# Patient Record
Sex: Female | Born: 1960 | Race: Black or African American | Hispanic: No | Marital: Single | Smoking: Current every day smoker
Health system: Southern US, Community
[De-identification: ages and names within clinical notes are randomized; demographics above are authoritative.]

## PROBLEM LIST (undated history)

## (undated) DIAGNOSIS — J42 Unspecified chronic bronchitis: Secondary | ICD-10-CM

---

## 2008-03-09 ENCOUNTER — Emergency Department (HOSPITAL_COMMUNITY): Admission: EM | Admit: 2008-03-09 | Discharge: 2008-03-09 | Payer: Self-pay | Admitting: Emergency Medicine

## 2008-03-18 ENCOUNTER — Emergency Department (HOSPITAL_COMMUNITY): Admission: EM | Admit: 2008-03-18 | Discharge: 2008-03-18 | Payer: Self-pay | Admitting: Emergency Medicine

## 2009-06-27 ENCOUNTER — Emergency Department (HOSPITAL_BASED_OUTPATIENT_CLINIC_OR_DEPARTMENT_OTHER): Admission: EM | Admit: 2009-06-27 | Discharge: 2009-06-27 | Payer: Self-pay | Admitting: Emergency Medicine

## 2009-06-27 ENCOUNTER — Ambulatory Visit: Payer: Self-pay | Admitting: Diagnostic Radiology

## 2009-07-08 ENCOUNTER — Ambulatory Visit: Payer: Self-pay | Admitting: Diagnostic Radiology

## 2009-07-08 ENCOUNTER — Emergency Department (HOSPITAL_BASED_OUTPATIENT_CLINIC_OR_DEPARTMENT_OTHER): Admission: EM | Admit: 2009-07-08 | Discharge: 2009-07-08 | Payer: Self-pay | Admitting: Emergency Medicine

## 2010-07-13 LAB — RAPID STREP SCREEN (MED CTR MEBANE ONLY): Streptococcus, Group A Screen (Direct): POSITIVE — AB

## 2010-07-30 ENCOUNTER — Emergency Department (HOSPITAL_BASED_OUTPATIENT_CLINIC_OR_DEPARTMENT_OTHER)
Admission: EM | Admit: 2010-07-30 | Discharge: 2010-07-30 | Disposition: A | Discharge status: Home / Self Care | Payer: Medicaid Other | Attending: Emergency Medicine | Admitting: Emergency Medicine

## 2010-07-30 DIAGNOSIS — F17200 Nicotine dependence, unspecified, uncomplicated: Secondary | ICD-10-CM | POA: Insufficient documentation

## 2010-07-30 DIAGNOSIS — Z8739 Personal history of other diseases of the musculoskeletal system and connective tissue: Secondary | ICD-10-CM | POA: Insufficient documentation

## 2010-07-30 DIAGNOSIS — J029 Acute pharyngitis, unspecified: Secondary | ICD-10-CM | POA: Insufficient documentation

## 2010-07-30 DIAGNOSIS — J45909 Unspecified asthma, uncomplicated: Secondary | ICD-10-CM | POA: Insufficient documentation

## 2010-07-30 LAB — RAPID STREP SCREEN (MED CTR MEBANE ONLY): Streptococcus, Group A Screen (Direct): NEGATIVE
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
## 2018-07-01 ENCOUNTER — Emergency Department (HOSPITAL_BASED_OUTPATIENT_CLINIC_OR_DEPARTMENT_OTHER)
Admission: EM | Admit: 2018-07-01 | Discharge: 2018-07-01 | Disposition: A | Discharge status: Home / Self Care | Payer: Medicaid Other | Attending: Emergency Medicine | Admitting: Emergency Medicine

## 2018-07-01 ENCOUNTER — Encounter (HOSPITAL_BASED_OUTPATIENT_CLINIC_OR_DEPARTMENT_OTHER): Payer: Self-pay | Admitting: Adult Health

## 2018-07-01 ENCOUNTER — Emergency Department (HOSPITAL_BASED_OUTPATIENT_CLINIC_OR_DEPARTMENT_OTHER): Payer: Medicaid Other

## 2018-07-01 ENCOUNTER — Other Ambulatory Visit: Payer: Self-pay

## 2018-07-01 DIAGNOSIS — R0989 Other specified symptoms and signs involving the circulatory and respiratory systems: Secondary | ICD-10-CM | POA: Diagnosis not present

## 2018-07-01 DIAGNOSIS — J069 Acute upper respiratory infection, unspecified: Secondary | ICD-10-CM | POA: Insufficient documentation

## 2018-07-01 DIAGNOSIS — B9789 Other viral agents as the cause of diseases classified elsewhere: Secondary | ICD-10-CM | POA: Diagnosis not present

## 2018-07-01 DIAGNOSIS — F17210 Nicotine dependence, cigarettes, uncomplicated: Secondary | ICD-10-CM | POA: Insufficient documentation

## 2018-07-01 DIAGNOSIS — R0981 Nasal congestion: Secondary | ICD-10-CM | POA: Insufficient documentation

## 2018-07-01 DIAGNOSIS — R05 Cough: Secondary | ICD-10-CM | POA: Diagnosis present

## 2018-07-01 HISTORY — DX: Unspecified chronic bronchitis: J42

## 2018-07-01 MED ORDER — ACETAMINOPHEN 500 MG PO TABS
1000.0000 mg | ORAL_TABLET | Freq: Once | ORAL | Status: AC
  Administered 2018-07-01: 1000 mg via ORAL
  Filled 2018-07-01: qty 2

## 2018-07-01 NOTE — ED Provider Notes (Signed)
[facility] EMERGENCY DEPARTMENT Provider Note   CSN: [id number] Arrival date & time: 07/01/18  1813    History   Chief Complaint Chief Complaint  Patient presents with  . Cough    HPI Misty Hawkins is a 58 y.o. female.     Patient is a 58 year old female who presents with cough and runny nose.  She states that started yesterday she has had runny nose congestion and coughing.  She is coughing up some white-yellow mucus although she feels like it is coming more from her throat.  She feels like she is coughing up mucus from her right side but denies any pain on coughing.  No chest pain.  No pleuritic symptoms.  No shortness of breath.  No known fevers although she has felt hot at times.  She has had no nausea or vomiting.  No recent travel or known sick contacts.  She has been taking TheraFlu at home with some improvement in symptoms.     Past Medical History:  Diagnosis Date  . Chronic bronchitis (HCC)     There are no active problems to display for this patient.   History reviewed. No pertinent surgical history.   OB History   No obstetric history on file.      Home Medications    Prior to Admission medications   Not on File    Family History History reviewed. No pertinent family history.  Social History Social History   Tobacco Use  . Smoking status: Current Every Day Smoker    Types: Cigarettes  Substance Use Topics  . Alcohol use: Yes  . Drug use: Never     Allergies   Patient has no known allergies.   Review of Systems Review of Systems  Constitutional: Positive for fatigue and fever (Subjective). Negative for chills and diaphoresis.  HENT: Positive for congestion, postnasal drip and rhinorrhea. Negative for sneezing.   Eyes: Negative.   Respiratory: Positive for cough. Negative for chest tightness and shortness of breath.   Cardiovascular: Negative for chest pain and leg swelling.  Gastrointestinal: Negative for abdominal pain,  blood in stool, diarrhea, nausea and vomiting.  Genitourinary: Negative for difficulty urinating, flank pain, frequency and hematuria.  Musculoskeletal: Negative for arthralgias and back pain.  Skin: Negative for rash.  Neurological: Positive for headaches. Negative for dizziness, speech difficulty, weakness and numbness.     Physical Exam Updated Vital Signs BP 114/69   Pulse 86   Temp 98.6 F (37 C) (Oral)   Resp 20   SpO2 94%   Physical Exam Constitutional:      Appearance: She is well-developed.  HENT:     Head: Normocephalic and atraumatic.     Nose: Nose normal.     Mouth/Throat:     Mouth: Mucous membranes are moist.     Pharynx: No oropharyngeal exudate or posterior oropharyngeal erythema.  Eyes:     Pupils: Pupils are equal, round, and reactive to light.  Neck:     Musculoskeletal: Normal range of motion and neck supple.  Cardiovascular:     Rate and Rhythm: Normal rate and regular rhythm.     Heart sounds: Normal heart sounds.  Pulmonary:     Effort: Pulmonary effort is normal. No respiratory distress.     Breath sounds: Normal breath sounds. No wheezing or rales.  Chest:     Chest wall: No tenderness.  Abdominal:     General: Bowel sounds are normal.     Palpations: Abdomen is  soft.     Tenderness: There is no abdominal tenderness. There is no guarding or rebound.  Musculoskeletal: Normal range of motion.  Lymphadenopathy:     Cervical: No cervical adenopathy.  Skin:    General: Skin is warm and dry.     Findings: No rash.  Neurological:     Mental Status: She is alert and oriented to person, place, and time.      ED Treatments / Results  Labs (all labs ordered are listed, but only abnormal results are displayed) Labs Reviewed - No data to display  EKG None  Radiology Dg Chest 2 View  Result Date: 07/01/2018 CLINICAL DATA:  Cough and fever EXAM: CHEST - 2 VIEW COMPARISON:  Chest radiograph November 03, 2010 and chest CT May 17, 2013  FINDINGS: The lungs are clear. The heart size and pulmonary vascularity are normal. No adenopathy. There is degenerative change in midthoracic spine. There is mid and lower thoracic levoscoliosis. IMPRESSION: No edema or consolidation. Electronically Signed   By: Bretta Bang III M.D.   On: 07/01/2018 18:51    Procedures Procedures (including critical care time)  Medications Ordered in ED Medications - No data to display   Initial Impression / Assessment and Plan / ED Course  I have reviewed the triage vital signs and the nursing notes.  Pertinent labs & imaging results that were available during my care of the patient were reviewed by me and considered in my medical decision making (see chart for details).        Patient is a well-appearing female who presents with URI symptoms.  She is afebrile here.  She has no shortness of breath or chest pain.  Her lungs are clear without suggestions of pneumonia.  Her chest x-ray is clear without evidence of pneumonia.  This is likely viral in nature.  She was discharged home in good condition.  Symptomatic care instructions were given.  Return precautions were given.  Final Clinical Impressions(s) / ED Diagnoses   Final diagnoses:  Viral URI with cough    ED Discharge Orders    None       Rolan Bucco, MD 07/01/18 2004

## 2018-07-01 NOTE — ED Triage Notes (Signed)
PResents with 24 hours of cough, fever chills. Denies SOB. Endorses pain when breathing in her right side. C/o headaches as well. She denies travel or recent contact with sick people.
